# Patient Record
Sex: Male | Born: 1995 | Race: White | Hispanic: No | Marital: Single | Smoking: Current every day smoker
Health system: Southern US, Community
[De-identification: ages and names within clinical notes are randomized; demographics above are authoritative.]

---
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
## 2013-06-11 ENCOUNTER — Emergency Department (HOSPITAL_COMMUNITY): Payer: Medicaid Other

## 2013-06-11 ENCOUNTER — Emergency Department (HOSPITAL_COMMUNITY)
Admission: EM | Admit: 2013-06-11 | Discharge: 2013-06-11 | Disposition: A | Discharge status: Home / Self Care | Payer: Medicaid Other | Attending: Emergency Medicine | Admitting: Emergency Medicine

## 2013-06-11 ENCOUNTER — Encounter (HOSPITAL_COMMUNITY): Payer: Self-pay | Admitting: Emergency Medicine

## 2013-06-11 DIAGNOSIS — N1330 Unspecified hydronephrosis: Secondary | ICD-10-CM | POA: Insufficient documentation

## 2013-06-11 DIAGNOSIS — R109 Unspecified abdominal pain: Secondary | ICD-10-CM

## 2013-06-11 DIAGNOSIS — R112 Nausea with vomiting, unspecified: Secondary | ICD-10-CM

## 2013-06-11 DIAGNOSIS — N23 Unspecified renal colic: Secondary | ICD-10-CM | POA: Insufficient documentation

## 2013-06-11 LAB — URINALYSIS, ROUTINE W REFLEX MICROSCOPIC
Glucose, UA: NEGATIVE mg/dL
Ketones, ur: 15 mg/dL — AB
LEUKOCYTES UA: NEGATIVE
NITRITE: NEGATIVE
PH: 5.5 (ref 5.0–8.0)
Protein, ur: 30 mg/dL — AB
SPECIFIC GRAVITY, URINE: 1.025 (ref 1.005–1.030)
UROBILINOGEN UA: 1 mg/dL (ref 0.0–1.0)

## 2013-06-11 LAB — COMPREHENSIVE METABOLIC PANEL
ALK PHOS: 101 U/L (ref 39–117)
ALT: 10 U/L (ref 0–53)
AST: 20 U/L (ref 0–37)
Albumin: 4.9 g/dL (ref 3.5–5.2)
BILIRUBIN TOTAL: 0.4 mg/dL (ref 0.3–1.2)
BUN: 10 mg/dL (ref 6–23)
CHLORIDE: 102 meq/L (ref 96–112)
CO2: 23 meq/L (ref 19–32)
Calcium: 10 mg/dL (ref 8.4–10.5)
Creatinine, Ser: 0.95 mg/dL (ref 0.50–1.35)
GFR calc non Af Amer: >90 mL/min (ref >90)
GLUCOSE: 111 mg/dL — AB (ref 70–99)
POTASSIUM: 3.7 meq/L (ref 3.7–5.3)
SODIUM: 140 meq/L (ref 137–147)
TOTAL PROTEIN: 7.3 g/dL (ref 6.0–8.3)

## 2013-06-11 LAB — CBC WITH DIFFERENTIAL/PLATELET
Basophils Absolute: 0 10*3/uL (ref 0.0–0.1)
Basophils Relative: 0 % (ref 0–1)
EOS ABS: 0.1 10*3/uL (ref 0.0–0.7)
Eosinophils Relative: 2 % (ref 0–5)
HCT: 39.9 % (ref 39.0–52.0)
HEMOGLOBIN: 14.3 g/dL (ref 13.0–17.0)
LYMPHS ABS: 2.5 10*3/uL (ref 0.7–4.0)
LYMPHS PCT: 32 % (ref 12–46)
MCH: 29.1 pg (ref 26.0–34.0)
MCHC: 35.8 g/dL (ref 30.0–36.0)
MCV: 81.3 fL (ref 78.0–100.0)
MONOS PCT: 7 % (ref 3–12)
Monocytes Absolute: 0.6 10*3/uL (ref 0.1–1.0)
NEUTROS ABS: 4.6 10*3/uL (ref 1.7–7.7)
NEUTROS PCT: 59 % (ref 43–77)
PLATELETS: 195 10*3/uL (ref 150–400)
RBC: 4.91 MIL/uL (ref 4.22–5.81)
RDW: 12.5 % (ref 11.5–15.5)
WBC: 7.8 10*3/uL (ref 4.0–10.5)

## 2013-06-11 LAB — URINE MICROSCOPIC-ADD ON

## 2013-06-11 MED ORDER — CEPHALEXIN 500 MG PO CAPS: 500.0000 mg | ORAL_CAPSULE | Freq: Four times a day (QID) | ORAL | Status: DC

## 2013-06-11 MED ORDER — PROMETHAZINE HCL 25 MG PO TABS: 25.0000 mg | ORAL_TABLET | Freq: Four times a day (QID) | ORAL | Status: DC | PRN

## 2013-06-11 MED ORDER — KETOROLAC TROMETHAMINE 30 MG/ML IJ SOLN
30.0000 mg | Freq: Once | INTRAMUSCULAR | Status: AC
  Administered 2013-06-11: 30 mg via INTRAVENOUS
  Filled 2013-06-11: qty 1

## 2013-06-11 MED ORDER — PROMETHAZINE HCL 25 MG/ML IJ SOLN
25.0000 mg | Freq: Once | INTRAMUSCULAR | Status: AC
  Administered 2013-06-11: 25 mg via INTRAVENOUS
  Filled 2013-06-11: qty 1

## 2013-06-11 MED ORDER — OXYCODONE-ACETAMINOPHEN 5-325 MG PO TABS: ORAL_TABLET | ORAL | Status: DC

## 2013-06-11 MED ORDER — IBUPROFEN 600 MG PO TABS: 600.0000 mg | ORAL_TABLET | Freq: Four times a day (QID) | ORAL | Status: DC | PRN

## 2013-06-11 MED ORDER — SODIUM CHLORIDE 0.9 % IV BOLUS (SEPSIS)
1000.0000 mL | INTRAVENOUS | Status: AC
  Administered 2013-06-11: 1000 mL via INTRAVENOUS

## 2013-06-11 MED ORDER — HYDROMORPHONE HCL PF 1 MG/ML IJ SOLN
1.0000 mg | Freq: Once | INTRAMUSCULAR | Status: AC
Start: 2013-06-11 — End: 2013-06-11
  Administered 2013-06-11: 1 mg via INTRAVENOUS
  Filled 2013-06-11: qty 1

## 2013-06-11 NOTE — ED Notes (Signed)
Pt. reports right low back pain onset this evening , denies injury / no hematuria .

## 2013-06-11 NOTE — ED Notes (Signed)
Laid down on couch, got up and immediately started feeling intense right flank pain.  Denies injury but did move furniture 3 days ago.  Unable to get comfortable in bed.  Denies difficulty voiding.  Emesis since this began.

## 2013-06-11 NOTE — ED Provider Notes (Signed)
CSN: [id number]     Arrival date & time 06/11/13  1927 History   First MD Initiated Contact with Patient 06/11/13 2104     Chief Complaint  Patient presents with  . Back Pain     (Consider location/radiation/quality/duration/timing/severity/associated sxs/prior Treatment) HPI Pt is a 18yo male c/o sudden onset right flank pain that started around 6:30PM this evening. Pain is constant, sharp and cramping, waxing and waning, 9/10 at worst.  Reports associated nausea and vomiting that started on way to ED.  Reports 3-4 episodes of emesis.  Pt reports lifting furniture 3 days ago but denies pain at that time. Denies known injuries. Denies hx of renal stones or similar symptoms. Denies fever or hematuria.  No pain medication PTA. Denies penial pain or discharge. Denies scrotal pain or swelling.   History reviewed. No pertinent past medical history. History reviewed. No pertinent past surgical history. No family history on file. History  Substance Use Topics  . Smoking status: Never Smoker   . Smokeless tobacco: Not on file  . Alcohol Use: No    Review of Systems  Constitutional: Positive for diaphoresis. Negative for fever, chills and fatigue.  Gastrointestinal: Positive for nausea and vomiting. Negative for abdominal pain and diarrhea.  Genitourinary: Positive for flank pain ( right). Negative for dysuria, urgency, hematuria, decreased urine volume, discharge, penile swelling, scrotal swelling, penile pain and testicular pain.  Musculoskeletal: Positive for back pain.  All other systems reviewed and are negative.     Allergies  Review of patient's allergies indicates no known allergies.  Home Medications   Prior to Admission medications   Not on File   BP 132/80  Pulse 78  Temp(Src) 98.7 F (37.1 C) (Oral)  Resp 20  SpO2 99% Physical Exam  Nursing note and vitals reviewed. Constitutional: He is oriented to person, place, and time. He appears well-developed and  well-nourished.  Lying on right side, leaning over bed vomiting into emesis bag. Pt appears tearful and uncomfortable. Shaking.   HENT:  Head: Normocephalic and atraumatic.  Eyes: Conjunctivae are normal. No scleral icterus.  Neck: Normal range of motion. Neck supple.  Cardiovascular: Normal rate, regular rhythm and normal heart sounds.   Pulmonary/Chest: Effort normal and breath sounds normal. No respiratory distress. He has no wheezes. He has no rales. He exhibits no tenderness.  No respiratory distress, able to speak in full sentences w/o difficulty. Lungs: CTAB  Abdominal: Soft. Bowel sounds are normal. He exhibits no distension and no mass. There is no tenderness. There is no rebound and no guarding.  Soft, non-distended, non-tender. No CVAT  Musculoskeletal: Normal range of motion. He exhibits tenderness. He exhibits no edema.  Mild tenderness in right flank. No midline spinal tenderness. FROM all 4 extremities w/o difficulty.  Neurological: He is alert and oriented to person, place, and time.  Skin: Skin is warm and dry.    ED Course  Procedures (including critical care time) Labs Review Labs Reviewed  URINALYSIS, ROUTINE W REFLEX MICROSCOPIC - Abnormal; Notable for the following:    Color, Urine AMBER (*)    APPearance CLOUDY (*)    Hgb urine dipstick LARGE (*)    Bilirubin Urine SMALL (*)    Ketones, ur 15 (*)    Protein, ur 30 (*)    All other components within normal limits  COMPREHENSIVE METABOLIC PANEL - Abnormal; Notable for the following:    Glucose, Bld 111 (*)    All other components within normal limits  URINE MICROSCOPIC-ADD  ON - Abnormal; Notable for the following:    Bacteria, UA FEW (*)    Crystals CA OXALATE CRYSTALS (*)    All other components within normal limits  URINE CULTURE  CBC WITH DIFFERENTIAL    Imaging Review [facility]  06/11/2013   CLINICAL DATA:  Back pain  EXAM: RENAL/URINARY TRACT ULTRASOUND COMPLETE  COMPARISON:  None.  FINDINGS: Right  Kidney:  Length: 11.6 cm.  Mild hydronephrosis is noted.  Left Kidney:  Length: 11.6 cm. Echogenicity within normal limits. No mass or hydronephrosis visualized.  Bladder:  Decompressed  IMPRESSION: Mild right hydronephrosis.   Electronically Signed   By: Alcide CleverMark  Lukens M.D.   On: 06/11/2013 22:36     EKG Interpretation None      MDM   Final diagnoses:  Renal colic on right side  Nausea & vomiting  Right flank pain  Hydronephrosis of right kidney    Pt presenting with signs and symptoms consistent with renal stone. Pt denies hx of renal stones. Pain is not fully reproducible with palpation, even with hx of moving furniture do not believe solely due to musculoskeletal pain.  UA- significant for hematuria, few bacteria also present. Will send urine culture.  Renal U/S: significant for mild right hydronephrosis. Will discharged home to f/u with PCP and Dr. Isabel CapriceGrapey, urology. Rx: keflex, ibuprofen, and percocet.  Return precautions provided. Pt verbalized understanding and agreement with tx plan.  Discussed pt with Dr. Fonnie JarvisBednar who agrees with tx plan.      Junius Finnerrin O'Malley, PA-C 06/11/13 2309

## 2013-06-11 NOTE — ED Notes (Signed)
Pt D/C with family at bed side. No distress at this time. VS WNL

## 2013-06-12 NOTE — ED Provider Notes (Signed)
Medical screening examination/treatment/procedure(s) were performed by non-physician practitioner and as supervising physician I was immediately available for consultation/collaboration.   EKG Interpretation None       Hurman HornJohn M Audyn Dimercurio, MD 06/12/13 2140

## 2013-06-13 LAB — URINE CULTURE
Colony Count: NO GROWTH
Culture: NO GROWTH

## 2015-04-17 ENCOUNTER — Encounter (HOSPITAL_COMMUNITY): Payer: Self-pay | Admitting: Emergency Medicine

## 2015-04-17 ENCOUNTER — Emergency Department (HOSPITAL_COMMUNITY)
Admission: EM | Admit: 2015-04-17 | Discharge: 2015-04-17 | Disposition: A | Discharge status: Home / Self Care | Payer: Medicaid Other | Attending: Emergency Medicine | Admitting: Emergency Medicine

## 2015-04-17 ENCOUNTER — Emergency Department (HOSPITAL_COMMUNITY): Payer: Medicaid Other

## 2015-04-17 DIAGNOSIS — Y9389 Activity, other specified: Secondary | ICD-10-CM | POA: Insufficient documentation

## 2015-04-17 DIAGNOSIS — W11XXXA Fall on and from ladder, initial encounter: Secondary | ICD-10-CM | POA: Insufficient documentation

## 2015-04-17 DIAGNOSIS — Y9289 Other specified places as the place of occurrence of the external cause: Secondary | ICD-10-CM | POA: Insufficient documentation

## 2015-04-17 DIAGNOSIS — S62316A Displaced fracture of base of fifth metacarpal bone, right hand, initial encounter for closed fracture: Secondary | ICD-10-CM | POA: Insufficient documentation

## 2015-04-17 DIAGNOSIS — Y990 Civilian activity done for income or pay: Secondary | ICD-10-CM | POA: Insufficient documentation

## 2015-04-17 DIAGNOSIS — S62318A Displaced fracture of base of other metacarpal bone, initial encounter for closed fracture: Secondary | ICD-10-CM

## 2015-04-17 DIAGNOSIS — Z792 Long term (current) use of antibiotics: Secondary | ICD-10-CM | POA: Insufficient documentation

## 2015-04-17 DIAGNOSIS — W19XXXA Unspecified fall, initial encounter: Secondary | ICD-10-CM

## 2015-04-17 MED ORDER — TRAMADOL HCL 50 MG PO TABS: 50.0000 mg | ORAL_TABLET | Freq: Four times a day (QID) | ORAL | Status: DC | PRN

## 2015-04-17 NOTE — ED Notes (Addendum)
Pt states he fell "20 or 30 feet off a ladder into some bushes" and landed on his right wrist on Friday around 1600. Pt states that at first, it did not hurt that badly, but the pain has intensified. Pt denies hitting his head nor anywhere else when he fell. Pt is able to move his fingers and has good cap refill and pulse.

## 2015-04-17 NOTE — Discharge Instructions (Signed)
Tramadol as prescribed as needed for pain.  Ice for 20 minutes every two hours while awake for the next two days.  Wear the splint for the next several weeks.   Metacarpal Fracture A metacarpal fracture is a break (fracture) of a bone in the hand. Metacarpals are the bones that extend from your knuckles to your wrist. In each hand, you have five metacarpal bones that connect your fingers and your thumb to your wrist. Some hand fractures have bone pieces that are close together and stable (simple). These fractures may be treated with only a splint or cast. Hand fractures that have many pieces of broken bone (comminuted), unstable bone pieces (displaced), or a bone that breaks through the skin (compound) usually require surgery. CAUSES This injury may be caused by:  A fall.  A hard, direct hit to your hand.  An injury that squeezes your knuckle, stretches your finger out of place, or crushes your hand. RISK FACTORS This injury is more likely to occur if:  You play contact sports.  You have certain bone diseases. SYMPTOMS  Symptoms of this type of fracture develop soon after the injury. Symptoms may include:  Swelling.  Pain.  Stiffness.  Increased pain with movement.  Bruising.  Inability to move a finger.  A shortened finger.  A finger knuckle that looks sunken in.  Unusual appearance of the hand or finger (deformity). DIAGNOSIS  This injury may be diagnosed based on your signs and symptoms, especially if you had a recent hand injury. Your health care provider will perform a physical exam. He or she may also order X-rays to confirm the diagnosis.  TREATMENT  Treatment for this injury depends on the type of fracture you have and how severe it is. Possible treatments include:  Non-reduction. This can be done if the bone does not need to be moved back into place. The fracture can be casted or splinted as it is.   Closed reduction. If your bone is stable and can be  moved back into place, you may only need to wear a cast or splint or have buddy taping.  Closed reduction with internal fixation (CRIF). This is the most common treatment. You may have this procedure if your bone can be moved back into place but needs more support. Wires, pins, or screws may be inserted through your skin to stabilize the fracture.  Open reduction with internal fixation (ORIF). This may be needed if your fracture is severe and unstable. It involves surgery to move your bone back into the right position. Screws, wires, or plates are used to stabilize the fracture. After all procedures, you may need to wear a cast or a splint for several weeks. You will also need to have follow-up X-rays to make sure that the bone is healing well and staying in position. After you no longer need your cast or splint, you may need physical therapy. This will help you to regain full movement and strength in your hand.  HOME CARE INSTRUCTIONS  If You Have a Cast:  Do not stick anything inside the cast to scratch your skin. Doing that increases your risk of infection.  Check the skin around the cast every day. Report any concerns to your health care provider. You may put lotion on dry skin around the edges of the cast. Do not apply lotion to the skin underneath the cast. If You Have a Splint:  Wear it as directed by your health care provider. Remove it only as  directed by your health care provider.  Loosen the splint if your fingers become numb and tingle, or if they turn cold and blue. Bathing  Cover the cast or splint with a watertight plastic bag to protect it from water while you take a bath or a shower. Do not let the cast or splint get wet. Managing Pain, Stiffness, and Swelling  If directed, apply ice to the injured area (if you have a splint, not a cast):  Put ice in a plastic bag.  Place a towel between your skin and the bag.  Leave the ice on for 20 minutes, 2-3 times a day.  Move  your fingers often to avoid stiffness and to lessen swelling.  Raise the injured area above the level of your heart while you are sitting or lying down. Driving  Do not drive or operate heavy machinery while taking pain medicine.  Do not drive while wearing a cast or splint on a hand that you use for driving. Activity  Return to your normal activities as directed by your health care provider. Ask your health care provider what activities are safe for you. General Instructions  Do not put pressure on any part of the cast or splint until it is fully hardened. This may take several hours.  Keep the cast or splint clean and dry.  Do not use any tobacco products, including cigarettes, chewing tobacco, or electronic cigarettes. Tobacco can delay bone healing. If you need help quitting, ask your health care provider.  Take medicines only as directed by your health care provider.  Keep all follow-up visits as directed by your health care provider. This is important. SEEK MEDICAL CARE IF:   Your pain is getting worse.  You have redness, swelling, or pain in the injured area.   You have fluid, blood, or pus coming from under your cast or splint.   You notice a bad smell coming from under your cast or splint.   You have a fever.  SEEK IMMEDIATE MEDICAL CARE IF:   You develop a rash.   You have trouble breathing.   Your skin or nails on your injured hand turn blue or gray even after you loosen your splint.  Your injured hand feels cold or becomes numb even after you loosen your splint.   You develop severe pain under the cast or in your hand.   This information is not intended to replace advice given to you by your health care provider. Make sure you discuss any questions you have with your health care provider.   Document Released: 01/15/2005 Document Revised: 10/06/2014 Document Reviewed: 11/04/2013 Elsevier Interactive Patient Education [employer]2016 [employer]

## 2015-04-17 NOTE — ED Notes (Signed)
Ortho Tech paged for splint application. 

## 2015-04-17 NOTE — ED Provider Notes (Signed)
CSN: [id number]     Arrival date & time 04/17/15  0051 History  By signing my name below, I, Bethel BornBritney McCollum, attest that this documentation has been prepared under the direction and in the presence of Geoffery Lyonsouglas Demontay Grantham, MD. Electronically Signed: Bethel BornBritney McCollum, ED Scribe. 04/17/2015. 3:33 AM   Chief Complaint  Patient presents with  . Hand Injury   The history is provided by the patient. No language interpreter was used.   Hayden DingwallJose A Reynolds is a 20 y.o. male who presents to the Emergency Department complaining of constant, aching, 8/10 in severity,  right hand and wrist pain with onset 2 days ago after a fall at work. Pt states that he fell off a ladder and landed in the bushed with his right hand underneath him. The pain is exacerbated by movement. Associated symptoms include abrasions at the right side of the neck and face. Pt denies LOC and other injury.  History reviewed. No pertinent past medical history. History reviewed. No pertinent past surgical history. No family history on file. Social History  Substance Use Topics  . Smoking status: Never Smoker   . Smokeless tobacco: None  . Alcohol Use: No    Review of Systems  10 Systems reviewed and all are negative for acute change except as noted in the HPI.  Allergies  Review of patient's allergies indicates no known allergies.  Home Medications   Prior to Admission medications   Medication Sig Start Date End Date Taking? Authorizing Provider  cephALEXin (KEFLEX) 500 MG capsule Take 1 capsule (500 mg total) by mouth 4 (four) times daily. 06/11/13   Junius FinnerErin O'Malley, PA-C  ibuprofen (ADVIL,MOTRIN) 600 MG tablet Take 1 tablet (600 mg total) by mouth every 6 (six) hours as needed. 06/11/13   Junius FinnerErin O'Malley, PA-C  oxyCODONE-acetaminophen (PERCOCET/ROXICET) 5-325 MG per tablet Take 1-2 pills every 4-6 hours as needed for pain. 06/11/13   Junius FinnerErin O'Malley, PA-C  promethazine (PHENERGAN) 25 MG tablet Take 1 tablet (25 mg total) by mouth every 6 (six)  hours as needed for nausea or vomiting. 06/11/13   Junius FinnerErin O'Malley, PA-C  traMADol (ULTRAM) 50 MG tablet Take 1 tablet (50 mg total) by mouth every 6 (six) hours as needed. 04/17/15   Geoffery Lyonsouglas Iyanla Eilers, MD   BP 122/80 mmHg  Pulse 60  Temp(Src) 98.3 F (36.8 C) (Oral)  Resp 16  SpO2 100% Physical Exam  Constitutional: He is oriented to person, place, and time. He appears well-developed and well-nourished.  HENT:  Head: Normocephalic.  Eyes: EOM are normal.  Neck: Normal range of motion.  Pulmonary/Chest: Effort normal.  Abdominal: He exhibits no distension.  Musculoskeletal: Normal range of motion.  TTP at the base of the 5th metacarpal with no significant swelling or deformity.   Neurological: He is alert and oriented to person, place, and time.  Psychiatric: He has a normal mood and affect.  Nursing note and vitals reviewed.   ED Course  Procedures (including critical care time) DIAGNOSTIC STUDIES: Oxygen Saturation is 100% on RA,  normal by my interpretation.    COORDINATION OF CARE: 3:30 AM Discussed treatment plan which includes right wrist XR with pt at bedside and pt agreed to plan.  Labs Review Labs Reviewed - No data to display  Imaging Review Dg Wrist Complete Right  04/17/2015  CLINICAL DATA:  Larey SeatFell 20 or 30 feet from a ladder, into some bushes. Accident occurred around 16:00 on 04/15/2015. EXAM: RIGHT WRIST - COMPLETE 3+ VIEW COMPARISON:  None. FINDINGS: Equivocal linear  lucency at the palmar base of the fifth metacarpal could represent an acute nondisplaced fracture. This is not confirmed on other views and is not confirmed on the accompanying hand radiograph series. It should be viewed with suspicion for acute fracture if there is point tenderness in the area. There are no other areas of suspicion for acute fracture. There is no dislocation. There is no radiopaque foreign body. IMPRESSION: Linear lucency at the fifth metacarpal base, seen only on one view. If there is point  tenderness in this area, this should be regarded as a probable nondisplaced fracture. Follow-up radiographs in 5 days will be conclusive, if clinically warranted. Electronically Signed   By: Ellery Plunk M.D.   On: 04/17/2015 02:17   I have personally reviewed and evaluated these images as part of my medical decision-making.   EKG Interpretation None      MDM   Final diagnoses:  Fall, initial encounter  Fracture of fifth metacarpal bone, closed, initial encounter    X-rays reveal a subtle fracture of the proximal fifth metacarpal. This will be treated with an older gutter splint to be worn for the next several weeks and when necessary follow-up with his primary doctor.  I personally performed the services described in this documentation, which was scribed in my presence. The recorded information has been reviewed and is accurate.       Geoffery Lyons, MD 04/18/15 [phone]

## 2015-06-19 ENCOUNTER — Emergency Department (HOSPITAL_COMMUNITY)
Admission: EM | Admit: 2015-06-19 | Discharge: 2015-06-19 | Disposition: A | Discharge status: Home / Self Care | Payer: Self-pay | Attending: Emergency Medicine | Admitting: Emergency Medicine

## 2015-06-19 ENCOUNTER — Encounter (HOSPITAL_COMMUNITY): Payer: Self-pay

## 2015-06-19 DIAGNOSIS — S01512A Laceration without foreign body of oral cavity, initial encounter: Secondary | ICD-10-CM | POA: Insufficient documentation

## 2015-06-19 DIAGNOSIS — Y999 Unspecified external cause status: Secondary | ICD-10-CM | POA: Insufficient documentation

## 2015-06-19 DIAGNOSIS — Y929 Unspecified place or not applicable: Secondary | ICD-10-CM | POA: Insufficient documentation

## 2015-06-19 DIAGNOSIS — Z23 Encounter for immunization: Secondary | ICD-10-CM | POA: Insufficient documentation

## 2015-06-19 DIAGNOSIS — S0121XA Laceration without foreign body of nose, initial encounter: Secondary | ICD-10-CM | POA: Insufficient documentation

## 2015-06-19 DIAGNOSIS — Y939 Activity, unspecified: Secondary | ICD-10-CM | POA: Insufficient documentation

## 2015-06-19 DIAGNOSIS — S032XXA Dislocation of tooth, initial encounter: Secondary | ICD-10-CM | POA: Insufficient documentation

## 2015-06-19 MED ORDER — TETANUS-DIPHTH-ACELL PERTUSSIS 5-2.5-18.5 LF-MCG/0.5 IM SUSP
0.5000 mL | Freq: Once | INTRAMUSCULAR | Status: AC
  Administered 2015-06-19: 0.5 mL via INTRAMUSCULAR
  Filled 2015-06-19: qty 0.5

## 2015-06-19 MED ORDER — PENICILLIN V POTASSIUM 500 MG PO TABS: 500.0000 mg | ORAL_TABLET | Freq: Four times a day (QID) | ORAL | Status: AC
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
## 2015-06-19 NOTE — ED Notes (Signed)
Discharge instructions, follow up care, and rx x1 reviewed with patient. Patient verbalized understanding. 

## 2015-06-19 NOTE — ED Notes (Signed)
No problems ambulating
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
## 2015-06-19 NOTE — Discharge Instructions (Signed)
Laceration Care, Adult  A laceration is a cut that goes through all layers of the skin. The cut also goes into the tissue that is right under the skin. Some cuts heal on their own. Others need to be closed with stitches (sutures), staples, skin adhesive strips, or wound glue. Taking care of your cut lowers your risk of infection and helps your cut to heal better.  HOW TO TAKE CARE OF YOUR CUT  For stitches or staples:  · Keep the wound clean and dry.  · If you were given a bandage (dressing), you should change it at least one time per day or as told by your doctor. You should also change it if it gets wet or dirty.  · Keep the wound completely dry for the first 24 hours or as told by your doctor. After that time, you may take a shower or a bath. However, make sure that the wound is not soaked in water until after the stitches or staples have been removed.  · Clean the wound one time each day or as told by your doctor:    Wash the wound with soap and water.    Rinse the wound with water until all of the soap comes off.    Pat the wound dry with a clean towel. Do not rub the wound.  · After you clean the wound, put a thin layer of antibiotic ointment on it as told by your doctor. This ointment:    Helps to prevent infection.    Keeps the bandage from sticking to the wound.  · Have your stitches or staples removed as told by your doctor.  If your doctor used skin adhesive strips:   · Keep the wound clean and dry.  · If you were given a bandage, you should change it at least one time per day or as told by your doctor. You should also change it if it gets dirty or wet.  · Do not get the skin adhesive strips wet. You can take a shower or a bath, but be careful to keep the wound dry.  · If the wound gets wet, pat it dry with a clean towel. Do not rub the wound.  · Skin adhesive strips fall off on their own. You can trim the strips as the wound heals. Do not remove any strips that are still stuck to the wound. They will  fall off after a while.  If your doctor used wound glue:  · Try to keep your wound dry, but you may briefly wet it in the shower or bath. Do not soak the wound in water, such as by swimming.  · After you take a shower or a bath, gently pat the wound dry with a clean towel. Do not rub the wound.  · Do not do any activities that will make you really sweaty until the skin glue has fallen off on its own.  · Do not apply liquid, cream, or ointment medicine to your wound while the skin glue is still on.  · If you were given a bandage, you should change it at least one time per day or as told by your doctor. You should also change it if it gets dirty or wet.  · If a bandage is placed over the wound, do not let the tape for the bandage touch the skin glue.  · Do not pick at the glue. The skin glue usually stays on for 5-10 days. Then, it   falls off of the skin.  General Instructions   · To help prevent scarring, make sure to cover your wound with sunscreen whenever you are outside after stitches are removed, after adhesive strips are removed, or when wound glue stays in place and the wound is healed. Make sure to wear a sunscreen of at least 30 SPF.  · Take over-the-counter and prescription medicines only as told by your doctor.  · If you were given antibiotic medicine or ointment, take or apply it as told by your doctor. Do not stop using the antibiotic even if your wound is getting better.  · Do not scratch or pick at the wound.  · Keep all follow-up visits as told by your doctor. This is important.  · Check your wound every day for signs of infection. Watch for:    Redness, swelling, or pain.    Fluid, blood, or pus.  · Raise (elevate) the injured area above the level of your heart while you are sitting or lying down, if possible.  GET HELP IF:  · You got a tetanus shot and you have any of these problems at the injection site:    Swelling.    Very bad pain.    Redness.    Bleeding.  · You have a fever.  · A wound that was  closed breaks open.  · You notice a bad smell coming from your wound or your bandage.  · You notice something coming out of the wound, such as wood or glass.  · Medicine does not help your pain.  · You have more redness, swelling, or pain at the site of your wound.  · You have fluid, blood, or pus coming from your wound.  · You notice a change in the color of your skin near your wound.  · You need to change the bandage often because fluid, blood, or pus is coming from the wound.  · You start to have a new rash.  · You start to have numbness around the wound.  GET HELP RIGHT AWAY IF:  · You have very bad swelling around the wound.  · Your pain suddenly gets worse and is very bad.  · You notice painful lumps near the wound or on skin that is anywhere on your body.  · You have a red streak going away from your wound.  · The wound is on your hand or foot and you cannot move a finger or toe like you usually can.  · The wound is on your hand or foot and you notice that your fingers or toes look pale or bluish.     This information is not intended to replace advice given to you by your health care provider. Make sure you discuss any questions you have with your health care provider.     Document Released: 07/04/2007 Document Revised: 06/01/2014 Document Reviewed: 01/11/2014  [employer]vier Interactive Patient Education ©2016 [employer]

## 2015-09-03 ENCOUNTER — Encounter (HOSPITAL_COMMUNITY): Payer: Self-pay | Admitting: *Deleted

## 2015-09-03 ENCOUNTER — Emergency Department (HOSPITAL_COMMUNITY)
Admission: EM | Admit: 2015-09-03 | Discharge: 2015-09-03 | Disposition: A | Discharge status: Home / Self Care | Payer: No Typology Code available for payment source | Attending: Emergency Medicine | Admitting: Emergency Medicine

## 2015-09-03 ENCOUNTER — Emergency Department (HOSPITAL_COMMUNITY): Payer: No Typology Code available for payment source

## 2015-09-03 DIAGNOSIS — R791 Abnormal coagulation profile: Secondary | ICD-10-CM | POA: Diagnosis not present

## 2015-09-03 DIAGNOSIS — Y939 Activity, unspecified: Secondary | ICD-10-CM | POA: Insufficient documentation

## 2015-09-03 DIAGNOSIS — Y999 Unspecified external cause status: Secondary | ICD-10-CM | POA: Diagnosis not present

## 2015-09-03 DIAGNOSIS — Y92410 Unspecified street and highway as the place of occurrence of the external cause: Secondary | ICD-10-CM | POA: Diagnosis not present

## 2015-09-03 DIAGNOSIS — S060X9A Concussion with loss of consciousness of unspecified duration, initial encounter: Secondary | ICD-10-CM | POA: Diagnosis not present

## 2015-09-03 DIAGNOSIS — S0990XA Unspecified injury of head, initial encounter: Secondary | ICD-10-CM | POA: Diagnosis present

## 2015-09-03 DIAGNOSIS — F17200 Nicotine dependence, unspecified, uncomplicated: Secondary | ICD-10-CM | POA: Insufficient documentation

## 2015-09-03 LAB — I-STAT CHEM 8, ED
BUN: <3 mg/dL — ABNORMAL LOW (ref 6–20)
CREATININE: 1.1 mg/dL (ref 0.61–1.24)
Calcium, Ion: 1.05 mmol/L — ABNORMAL LOW (ref 1.13–1.30)
Chloride: 105 mmol/L (ref 101–111)
Glucose, Bld: 94 mg/dL (ref 65–99)
HEMATOCRIT: 45 % (ref 39.0–52.0)
HEMOGLOBIN: 15.3 g/dL (ref 13.0–17.0)
POTASSIUM: 3.6 mmol/L (ref 3.5–5.1)
SODIUM: 143 mmol/L (ref 135–145)
TCO2: 22 mmol/L (ref 0–100)

## 2015-09-03 LAB — COMPREHENSIVE METABOLIC PANEL
ALBUMIN: 4.5 g/dL (ref 3.5–5.0)
ALT: 14 U/L — AB (ref 17–63)
AST: 21 U/L (ref 15–41)
Alkaline Phosphatase: 95 U/L (ref 38–126)
Anion gap: 9 (ref 5–15)
CHLORIDE: 109 mmol/L (ref 101–111)
CO2: 23 mmol/L (ref 22–32)
CREATININE: 0.76 mg/dL (ref 0.61–1.24)
Calcium: 9.2 mg/dL (ref 8.9–10.3)
GFR calc Af Amer: >60 mL/min (ref >60)
GFR calc non Af Amer: >60 mL/min (ref >60)
Glucose, Bld: 100 mg/dL — ABNORMAL HIGH (ref 65–99)
POTASSIUM: 3.6 mmol/L (ref 3.5–5.1)
SODIUM: 141 mmol/L (ref 135–145)
Total Bilirubin: 0.6 mg/dL (ref 0.3–1.2)
Total Protein: 7.3 g/dL (ref 6.5–8.1)

## 2015-09-03 LAB — ETHANOL: Alcohol, Ethyl (B): 254 mg/dL — ABNORMAL HIGH (ref <5)

## 2015-09-03 LAB — CBC
HEMATOCRIT: 45.1 % (ref 39.0–52.0)
Hemoglobin: 15.9 g/dL (ref 13.0–17.0)
MCH: 30.1 pg (ref 26.0–34.0)
MCHC: 35.3 g/dL (ref 30.0–36.0)
MCV: 85.4 fL (ref 78.0–100.0)
Platelets: 213 10*3/uL (ref 150–400)
RBC: 5.28 MIL/uL (ref 4.22–5.81)
RDW: 12.7 % (ref 11.5–15.5)
WBC: 7.5 10*3/uL (ref 4.0–10.5)

## 2015-09-03 LAB — PROTIME-INR
INR: 1.11
PROTHROMBIN TIME: 14.4 s (ref 11.4–15.2)

## 2015-09-03 LAB — I-STAT CG4 LACTIC ACID, ED: Lactic Acid, Venous: 1.49 mmol/L (ref 0.5–1.9)

## 2015-09-03 MED ORDER — SODIUM CHLORIDE 0.9 % IV BOLUS (SEPSIS)
1000.0000 mL | Freq: Once | INTRAVENOUS | Status: AC
  Administered 2015-09-03: 1000 mL via INTRAVENOUS

## 2015-09-03 MED ORDER — IBUPROFEN 600 MG PO TABS: 600.0000 mg | ORAL_TABLET | Freq: Four times a day (QID) | ORAL | 0 refills | Status: AC | PRN

## 2015-09-03 MED ORDER — SODIUM CHLORIDE 0.9 % IV SOLN: INTRAVENOUS | Status: DC

## 2015-09-03 MED ORDER — ACETAMINOPHEN 500 MG PO TABS: 500.0000 mg | ORAL_TABLET | Freq: Four times a day (QID) | ORAL | 0 refills | Status: AC | PRN
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
## 2015-09-03 NOTE — ED Notes (Signed)
Pt verbalized understanding of d/c instructions and has no further questions. Pt stable and NAD. Pt d/c home with family driving.  

## 2015-09-03 NOTE — ED Notes (Signed)
Patient being transported to x-ray.
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
## 2016-10-24 DIAGNOSIS — Y9389 Activity, other specified: Secondary | ICD-10-CM | POA: Insufficient documentation

## 2016-10-24 DIAGNOSIS — Y998 Other external cause status: Secondary | ICD-10-CM | POA: Insufficient documentation

## 2016-10-24 DIAGNOSIS — S61412A Laceration without foreign body of left hand, initial encounter: Secondary | ICD-10-CM | POA: Insufficient documentation

## 2016-10-24 DIAGNOSIS — Y92149 Unspecified place in prison as the place of occurrence of the external cause: Secondary | ICD-10-CM | POA: Insufficient documentation

## 2016-10-24 DIAGNOSIS — Z23 Encounter for immunization: Secondary | ICD-10-CM | POA: Insufficient documentation

## 2016-10-24 DIAGNOSIS — F17200 Nicotine dependence, unspecified, uncomplicated: Secondary | ICD-10-CM | POA: Insufficient documentation

## 2016-10-25 ENCOUNTER — Emergency Department (HOSPITAL_COMMUNITY)
Admission: EM | Admit: 2016-10-25 | Discharge: 2016-10-25 | Disposition: A | Discharge status: Home / Self Care | Payer: Self-pay | Attending: Emergency Medicine | Admitting: Emergency Medicine

## 2016-10-25 ENCOUNTER — Encounter (HOSPITAL_COMMUNITY): Payer: Self-pay | Admitting: *Deleted

## 2016-10-25 DIAGNOSIS — S61412A Laceration without foreign body of left hand, initial encounter: Secondary | ICD-10-CM

## 2016-10-25 MED ORDER — BACITRACIN ZINC 500 UNIT/GM EX OINT: TOPICAL_OINTMENT | Freq: Two times a day (BID) | CUTANEOUS | Status: DC

## 2016-10-25 MED ORDER — TETANUS-DIPHTH-ACELL PERTUSSIS 5-2.5-18.5 LF-MCG/0.5 IM SUSP
0.5000 mL | Freq: Once | INTRAMUSCULAR | Status: AC
  Administered 2016-10-25: 0.5 mL via INTRAMUSCULAR
  Filled 2016-10-25: qty 0.5

## 2016-10-25 MED ORDER — LIDOCAINE HCL (PF) 1 % IJ SOLN
5.0000 mL | Freq: Once | INTRAMUSCULAR | Status: AC
  Administered 2016-10-25: 5 mL
  Filled 2016-10-25: qty 5
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
## 2016-12-04 ENCOUNTER — Emergency Department (HOSPITAL_COMMUNITY): Payer: Self-pay

## 2016-12-04 ENCOUNTER — Emergency Department (HOSPITAL_COMMUNITY)
Admission: EM | Admit: 2016-12-04 | Discharge: 2016-12-04 | Disposition: A | Discharge status: Home / Self Care | Payer: Self-pay | Attending: Emergency Medicine | Admitting: Emergency Medicine

## 2016-12-04 ENCOUNTER — Encounter (HOSPITAL_COMMUNITY): Payer: Self-pay

## 2016-12-04 ENCOUNTER — Other Ambulatory Visit: Payer: Self-pay

## 2016-12-04 DIAGNOSIS — F10920 Alcohol use, unspecified with intoxication, uncomplicated: Secondary | ICD-10-CM

## 2016-12-04 DIAGNOSIS — Y999 Unspecified external cause status: Secondary | ICD-10-CM | POA: Insufficient documentation

## 2016-12-04 DIAGNOSIS — R1012 Left upper quadrant pain: Secondary | ICD-10-CM | POA: Insufficient documentation

## 2016-12-04 DIAGNOSIS — Y939 Activity, unspecified: Secondary | ICD-10-CM | POA: Insufficient documentation

## 2016-12-04 DIAGNOSIS — F17210 Nicotine dependence, cigarettes, uncomplicated: Secondary | ICD-10-CM | POA: Insufficient documentation

## 2016-12-04 DIAGNOSIS — S0990XA Unspecified injury of head, initial encounter: Secondary | ICD-10-CM

## 2016-12-04 DIAGNOSIS — F10120 Alcohol abuse with intoxication, uncomplicated: Secondary | ICD-10-CM | POA: Insufficient documentation

## 2016-12-04 DIAGNOSIS — R51 Headache: Secondary | ICD-10-CM | POA: Insufficient documentation

## 2016-12-04 DIAGNOSIS — Y929 Unspecified place or not applicable: Secondary | ICD-10-CM | POA: Insufficient documentation

## 2016-12-04 DIAGNOSIS — R0789 Other chest pain: Secondary | ICD-10-CM | POA: Insufficient documentation

## 2016-12-04 LAB — CBC WITH DIFFERENTIAL/PLATELET
Basophils Absolute: 0 10*3/uL (ref 0.0–0.1)
Basophils Relative: 0 %
EOS ABS: 0 10*3/uL (ref 0.0–0.7)
Eosinophils Relative: 0 %
HEMATOCRIT: 48.2 % (ref 39.0–52.0)
HEMOGLOBIN: 17.2 g/dL — AB (ref 13.0–17.0)
LYMPHS ABS: 2.2 10*3/uL (ref 0.7–4.0)
LYMPHS PCT: 24 %
MCH: 30.2 pg (ref 26.0–34.0)
MCHC: 35.7 g/dL (ref 30.0–36.0)
MCV: 84.6 fL (ref 78.0–100.0)
MONOS PCT: 3 %
Monocytes Absolute: 0.3 10*3/uL (ref 0.1–1.0)
NEUTROS ABS: 6.6 10*3/uL (ref 1.7–7.7)
NEUTROS PCT: 73 %
Platelets: 211 10*3/uL (ref 150–400)
RBC: 5.7 MIL/uL (ref 4.22–5.81)
RDW: 13.2 % (ref 11.5–15.5)
WBC: 9.1 10*3/uL (ref 4.0–10.5)

## 2016-12-04 LAB — COMPREHENSIVE METABOLIC PANEL
ALK PHOS: 92 U/L (ref 38–126)
ALT: 14 U/L — AB (ref 17–63)
AST: 22 U/L (ref 15–41)
Albumin: 4.6 g/dL (ref 3.5–5.0)
Anion gap: 10 (ref 5–15)
BILIRUBIN TOTAL: 0.7 mg/dL (ref 0.3–1.2)
BUN: <5 mg/dL — ABNORMAL LOW (ref 6–20)
CALCIUM: 9.3 mg/dL (ref 8.9–10.3)
CO2: 25 mmol/L (ref 22–32)
CREATININE: 0.8 mg/dL (ref 0.61–1.24)
Chloride: 106 mmol/L (ref 101–111)
GFR calc Af Amer: >60 mL/min (ref >60)
GFR calc non Af Amer: >60 mL/min (ref >60)
Glucose, Bld: 108 mg/dL — ABNORMAL HIGH (ref 65–99)
Potassium: 4.7 mmol/L (ref 3.5–5.1)
SODIUM: 141 mmol/L (ref 135–145)
TOTAL PROTEIN: 7.6 g/dL (ref 6.5–8.1)

## 2016-12-04 LAB — URINALYSIS, ROUTINE W REFLEX MICROSCOPIC
Bilirubin Urine: NEGATIVE
GLUCOSE, UA: NEGATIVE mg/dL
Hgb urine dipstick: NEGATIVE
KETONES UR: NEGATIVE mg/dL
LEUKOCYTES UA: NEGATIVE
NITRITE: NEGATIVE
PH: 6 (ref 5.0–8.0)
Protein, ur: NEGATIVE mg/dL
SPECIFIC GRAVITY, URINE: 1.004 — AB (ref 1.005–1.030)

## 2016-12-04 LAB — RAPID URINE DRUG SCREEN, HOSP PERFORMED
Amphetamines: NOT DETECTED
BARBITURATES: NOT DETECTED
Benzodiazepines: NOT DETECTED
COCAINE: NOT DETECTED
Opiates: NOT DETECTED
Tetrahydrocannabinol: NOT DETECTED

## 2016-12-04 LAB — ETHANOL: Alcohol, Ethyl (B): 270 mg/dL — ABNORMAL HIGH (ref <10)

## 2016-12-04 MED ORDER — SODIUM CHLORIDE 0.9 % IV BOLUS (SEPSIS)
1000.0000 mL | Freq: Once | INTRAVENOUS | Status: AC
  Administered 2016-12-04: 1000 mL via INTRAVENOUS

## 2016-12-04 MED ORDER — IOPAMIDOL (ISOVUE-300) INJECTION 61%
INTRAVENOUS | Status: AC
  Administered 2016-12-04: 100 mL
  Filled 2016-12-04: qty 100

## 2016-12-04 NOTE — ED Notes (Signed)
Security called to patient's room per dr wards request until patient can be discharged
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
## 2016-12-04 NOTE — ED Notes (Signed)
Pt refused discharge vitals 
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
## 2016-12-04 NOTE — ED Provider Notes (Signed)
I received this patient in signout from Dr. Elesa MassedWard.  We were awaiting CT imaging results.  CT of chest abdomen pelvis was negative for acute injury.  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  The patient has been ambulatory without problems here and has no extremity weakness.  Discussed supportive measures and return precautions and patient discharged in satisfactory condition.   Little, Ambrose Finlandachel Morgan, MD 12/04/16 1023

## 2016-12-04 NOTE — Discharge Instructions (Signed)
You may alternate Tylenol 1000 mg every 6 hours as needed for pain and Ibuprofen 800 mg every 8 hours as needed for pain.  Please take Ibuprofen with food. ° °

## 2018-01-21 ENCOUNTER — Encounter (HOSPITAL_COMMUNITY): Payer: Self-pay

## 2018-01-21 ENCOUNTER — Emergency Department (HOSPITAL_COMMUNITY)
Admission: EM | Admit: 2018-01-21 | Discharge: 2018-01-21 | Discharge status: Against Medical Advice | Payer: Self-pay | Attending: Emergency Medicine | Admitting: Emergency Medicine

## 2018-01-21 ENCOUNTER — Emergency Department (HOSPITAL_COMMUNITY): Admission: EM | Admit: 2018-01-21 | Discharge: 2018-01-21 | Discharge status: Against Medical Advice | Payer: Self-pay

## 2018-01-21 ENCOUNTER — Other Ambulatory Visit: Payer: Self-pay

## 2018-01-21 ENCOUNTER — Encounter (HOSPITAL_COMMUNITY): Payer: Self-pay | Admitting: Emergency Medicine

## 2018-01-21 DIAGNOSIS — Y999 Unspecified external cause status: Secondary | ICD-10-CM | POA: Insufficient documentation

## 2018-01-21 DIAGNOSIS — Y929 Unspecified place or not applicable: Secondary | ICD-10-CM | POA: Insufficient documentation

## 2018-01-21 DIAGNOSIS — Z5321 Procedure and treatment not carried out due to patient leaving prior to being seen by health care provider: Secondary | ICD-10-CM | POA: Insufficient documentation

## 2018-01-21 DIAGNOSIS — Y939 Activity, unspecified: Secondary | ICD-10-CM | POA: Insufficient documentation

## 2018-01-21 DIAGNOSIS — S79921A Unspecified injury of right thigh, initial encounter: Secondary | ICD-10-CM | POA: Insufficient documentation
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
## 2018-01-21 NOTE — ED Notes (Signed)
No answer for vitals x2 
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
## 2018-01-21 NOTE — ED Notes (Signed)
Pt called for vital signs no answer no response.

## 2018-01-21 NOTE — ED Notes (Signed)
No response for vitals x1 ?
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
